# Patient Record
Sex: Male | Born: 1995 | Race: Black or African American | Hispanic: No | Marital: Single | Smoking: Light tobacco smoker
Health system: Southern US, Community
[De-identification: ages and names within clinical notes are randomized; demographics above are authoritative.]

## PROBLEM LIST (undated history)

## (undated) DIAGNOSIS — J45909 Unspecified asthma, uncomplicated: Secondary | ICD-10-CM

---
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
## 2015-10-30 ENCOUNTER — Emergency Department (HOSPITAL_COMMUNITY)
Admission: EM | Admit: 2015-10-30 | Discharge: 2015-10-30 | Disposition: A | Discharge status: Home / Self Care | Payer: Medicaid Other | Attending: Emergency Medicine | Admitting: Emergency Medicine

## 2015-10-30 ENCOUNTER — Encounter (HOSPITAL_COMMUNITY): Payer: Self-pay | Admitting: *Deleted

## 2015-10-30 DIAGNOSIS — J45909 Unspecified asthma, uncomplicated: Secondary | ICD-10-CM | POA: Insufficient documentation

## 2015-10-30 DIAGNOSIS — Z91010 Allergy to peanuts: Secondary | ICD-10-CM | POA: Insufficient documentation

## 2015-10-30 DIAGNOSIS — Z91013 Allergy to seafood: Secondary | ICD-10-CM | POA: Insufficient documentation

## 2015-10-30 DIAGNOSIS — R0600 Dyspnea, unspecified: Secondary | ICD-10-CM | POA: Insufficient documentation

## 2015-10-30 DIAGNOSIS — F17210 Nicotine dependence, cigarettes, uncomplicated: Secondary | ICD-10-CM | POA: Insufficient documentation

## 2015-10-30 DIAGNOSIS — Z91018 Allergy to other foods: Secondary | ICD-10-CM | POA: Insufficient documentation

## 2015-10-30 DIAGNOSIS — R21 Rash and other nonspecific skin eruption: Secondary | ICD-10-CM | POA: Insufficient documentation

## 2015-10-30 DIAGNOSIS — T7840XA Allergy, unspecified, initial encounter: Secondary | ICD-10-CM

## 2015-10-30 MED ORDER — FAMOTIDINE IN NACL 20-0.9 MG/50ML-% IV SOLN
20.0000 mg | Freq: Once | INTRAVENOUS | Status: AC
  Administered 2015-10-30: 20 mg via INTRAVENOUS
  Filled 2015-10-30: qty 50

## 2015-10-30 MED ORDER — PREDNISONE 20 MG PO TABS: 60.0000 mg | ORAL_TABLET | Freq: Every day | ORAL | Status: AC

## 2015-10-30 MED ORDER — FAMOTIDINE 20 MG PO TABS: 20.0000 mg | ORAL_TABLET | Freq: Two times a day (BID) | ORAL | Status: AC

## 2015-10-30 MED ORDER — DIPHENHYDRAMINE HCL 25 MG PO TABS: 25.0000 mg | ORAL_TABLET | Freq: Three times a day (TID) | ORAL | Status: AC | PRN

## 2015-10-30 MED ORDER — ALBUTEROL SULFATE HFA 108 (90 BASE) MCG/ACT IN AERS: 2.0000 | INHALATION_SPRAY | RESPIRATORY_TRACT | Status: AC | PRN

## 2015-10-30 MED ORDER — EPINEPHRINE HCL 0.1 MG/ML IJ SOSY
0.3000 mg | PREFILLED_SYRINGE | Freq: Once | INTRAMUSCULAR | Status: AC
  Administered 2015-10-30: 0.3 mg via INTRAMUSCULAR
  Filled 2015-10-30: qty 10

## 2015-10-30 MED ORDER — DIPHENHYDRAMINE HCL 50 MG/ML IJ SOLN
50.0000 mg | Freq: Once | INTRAMUSCULAR | Status: AC
  Administered 2015-10-30: 50 mg via INTRAVENOUS

## 2015-10-30 MED ORDER — EPINEPHRINE 0.3 MG/0.3ML IJ SOAJ: 0.3000 mg | Freq: Once | INTRAMUSCULAR | Status: AC

## 2015-10-30 MED ORDER — METHYLPREDNISOLONE SODIUM SUCC 125 MG IJ SOLR
125.0000 mg | Freq: Once | INTRAMUSCULAR | Status: AC
  Administered 2015-10-30: 125 mg via INTRAVENOUS
  Filled 2015-10-30: qty 2

## 2015-10-30 MED ORDER — EPINEPHRINE HCL 1 MG/ML IJ SOLN
INTRAMUSCULAR | Status: AC
  Filled 2015-10-30: qty 1

## 2015-10-30 MED ORDER — SODIUM CHLORIDE 0.9 % IV BOLUS (SEPSIS)
1000.0000 mL | Freq: Once | INTRAVENOUS | Status: AC
  Administered 2015-10-30: 1000 mL via INTRAVENOUS

## 2015-10-30 NOTE — ED Notes (Signed)
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  He had eaten a forth of the meal when nhe started getting a rash with itching and his tongue started swelling.  No swelling in the back of his throat.  He hada epi pen but it had expired

## 2015-10-30 NOTE — Discharge Instructions (Signed)
Hives Hives are itchy, red, swollen areas of the skin. They can vary in size and location on your body. Hives can come and go for hours or several days (acute hives) or for several weeks (chronic hives). Hives do not spread from person to person (noncontagious). They may get worse with scratching, exercise, and emotional stress. CAUSES   Allergic reaction to food, additives, or drugs.  Infections, including the common cold.  Illness, such as vasculitis, lupus, or thyroid disease.  Exposure to sunlight, heat, or cold.  Exercise.  Stress.  Contact with chemicals. SYMPTOMS   Red or white swollen patches on the skin. The patches may change size, shape, and location quickly and repeatedly.  Itching.  Swelling of the hands, feet, and face. This may occur if hives develop deeper in the skin. DIAGNOSIS  Your caregiver can usually tell what is wrong by performing a physical exam. Skin or blood tests may also be done to determine the cause of your hives. In some cases, the cause cannot be determined. TREATMENT  Mild cases usually get better with medicines such as antihistamines. Severe cases may require an emergency epinephrine injection. If the cause of your hives is known, treatment includes avoiding that trigger.  HOME CARE INSTRUCTIONS   Avoid causes that trigger your hives.  Take antihistamines as directed by your caregiver to reduce the severity of your hives. Non-sedating or low-sedating antihistamines are usually recommended. Do not drive while taking an antihistamine.  Take any other medicines prescribed for itching as directed by your caregiver.  Wear loose-fitting clothing.  Keep all follow-up appointments as directed by your caregiver. SEEK MEDICAL CARE IF:   You have persistent or severe itching that is not relieved with medicine.  You have painful or swollen joints. SEEK IMMEDIATE MEDICAL CARE IF:   You have a fever.  Your tongue or lips are swollen.  You have  trouble breathing or swallowing.  You feel tightness in the throat or chest.  You have abdominal pain. These problems may be the first sign of a life-threatening allergic reaction. Call your local emergency services (911 in [place] MAKE SURE YOU:   Understand these instructions.  Will watch your condition.  Will get help right away if you are not doing well or get worse.   This information is not intended to replace advice given to you by your health care provider. Make sure you discuss any questions you have with your health care provider.   Document Released: 04/22/2005 Document Revised: 04/27/2013 Document Reviewed: 07/16/2011 Elsevier Interactive Patient Education 2016 Elsevier Inc.   Epinephrine Injection Epinephrine is a medicine given by injection to temporarily treat an emergency allergic reaction. It is also used to treat severe asthmatic attacks and other lung problems. The medicine helps to enlarge (dilate) the small breathing tubes of the lungs. A life-threatening, sudden allergic reaction that involves the whole body is called anaphylaxis. Because of potential side effects, epinephrine should only be used as directed by your caregiver. RISKS AND COMPLICATIONS Possible side effects of epinephrine injections include:  Chest pain.  Irregular or rapid heartbeat.  Shortness of breath.  Nausea.  Vomiting.  Abdominal pain or cramping.  Sweating.  Dizziness.  Weakness.  Headache.  Nervousness. Report all side effects to your caregiver. HOW TO GIVE AN EPINEPHRINE INJECTION Give the epinephrine injection immediately when symptoms of a severe reaction begin. Inject the medicine into the outer thigh or any available, large muscle. Your caregiver can teach you how to do this. You  do not need to remove any clothing. After the injection, call your local emergency services (911 in [place] Even if you improve after the injection, you need to be examined at a hospital  emergency department. Epinephrine works quickly, but it also wears off quickly. Delayed reactions can occur. A delayed reaction may be as serious and dangerous as the initial reaction. HOME CARE INSTRUCTIONS  Make sure you and your family know how to give an epinephrine injection.  Use epinephrine injections as directed by your caregiver. Do not use this medicine more often or in larger doses than prescribed.  Always carry your epinephrine injection or anaphylaxis kit with you. This can be lifesaving if you have a severe reaction.  Store the medicine in a cool, dry place. If the medicine becomes discolored or cloudy, dispose of it properly and replace it with new medicine.  Check the expiration date on your medicine. It may be unsafe to use medicines past their expiration date.  Tell your caregiver about any other medicines you are taking. Some medicines can react badly with epinephrine.  Tell your caregiver about any medical conditions you have, such as diabetes, high blood pressure (hypertension), heart disease, irregular heartbeats, or if you are pregnant. SEEK IMMEDIATE MEDICAL CARE IF:  You have used an epinephrine injection. Call your local emergency services (911 in [place] Even if you improve after the injection, you need to be examined at a hospital emergency department to make sure your allergic reaction is under control. You will also be monitored for adverse effects from the medicine.  You have chest pain.  You have irregular or fast heartbeats.  You have shortness of breath.  You have severe headaches.  You have severe nausea, vomiting, or abdominal cramps.  You have severe pain, swelling, or redness in the area where you gave the injection.   This information is not intended to replace advice given to you by your health care provider. Make sure you discuss any questions you have with your health care provider.   Document Released: 04/19/2000 Document Revised: 07/15/2011  Document Reviewed: 11/09/2014 Elsevier Interactive Patient Education 2016 Elsevier Inc. Bronchospasm, Adult A bronchospasm is when the tubes that carry air in and out of your lungs (airways) spasm or tighten. During a bronchospasm it is hard to breathe. This is because the airways get smaller. A bronchospasm can be triggered by:  Allergies. These may be to animals, pollen, food, or mold.  Infection. This is a common cause of bronchospasm.  Exercise.  Irritants. These include pollution, cigarette smoke, strong odors, aerosol sprays, and paint fumes.  Weather changes.  Stress.  Being emotional. HOME CARE   Always have a plan for getting help. Know when to call your doctor and local emergency services (911 in the [place] Know where you can get emergency care.  Only take medicines as told by your doctor.  If you were prescribed an inhaler or nebulizer machine, ask your doctor how to use it correctly. Always use a spacer with your inhaler if you were given one.  Stay calm during an attack. Try to relax and breathe more slowly.  Control your home environment:  Change your heating and air conditioning filter at least once a month.  Limit your use of fireplaces and wood stoves.  Do not  smoke. Do not  allow smoking in your home.  Avoid perfumes and fragrances.  Get rid of pests (such as roaches and mice) and their droppings.  Throw away plants if you  see mold on them.  Keep your house clean and dust free.  Replace carpet with wood, tile, or vinyl flooring. Carpet can trap dander and dust.  Use allergy-proof pillows, mattress covers, and box spring covers.  Wash bed sheets and blankets every week in hot water. Dry them in a dryer.  Use blankets that are made of polyester or cotton.  Wash hands frequently. GET HELP IF:  You have muscle aches.  You have chest pain.  The thick spit you spit or cough up (sputum) changes from clear or white to yellow, green, gray, or  bloody.  The thick spit you spit or cough up gets thicker.  There are problems that may be related to the medicine you are given such as:  A rash.  Itching.  Swelling.  Trouble breathing. GET HELP RIGHT AWAY IF:  You feel you cannot breathe or catch your breath.  You cannot stop coughing.  Your treatment is not helping you breathe better.  You have very bad chest pain. MAKE SURE YOU:   Understand these instructions.  Will watch your condition.  Will get help right away if you are not doing well or get worse.   This information is not intended to replace advice given to you by your health care provider. Make sure you discuss any questions you have with your health care provider.   Document Released: 02/17/2009 Document Revised: 05/13/2014 Document Reviewed: 10/13/2012 Elsevier Interactive Patient Education [employer].  To find a primary care or specialty doctor please call [phone] or [phone] to access "[place] a Doctor Service."  You may also go on the [facility] website at [email or web address]  There are also multiple [facility] and [facility] practices throughout the [place] that are frequently accepting new patients. You may find a clinic that is close to your home and contact them.  [facility] -  [address] [place] [address] [phone]  [facility] in [place] (also locations in [place] and [place]) -  [place] [address] [address]  [place] [address] [phone]

## 2015-10-30 NOTE — ED Provider Notes (Signed)
TIME SEEN: By signing my name below, I, Samuel Hudson, attest that this documentation has been prepared under the direction and in the presence of Samuel N Ward, DO . Electronically Signed: Levon HedgerElizabeth Hudson, Scribe. 10/30/2015. 2:49 AM.  CHIEF COMPLAINT:  Chief Complaint  Patient presents with  . Allergic Reaction   HPI: HPI Comments:  Samuel Hudson is a 20 y.o. male with PMHx of soy, Shellfish and peanut allergy and asthma who presents to the Emergency Department complaining of hives, feeling like his throat swelling. Patient ate chinese food ~2 hours ago which he states may have contained nuts and experienced airway tightness. Pt complains of associated wheezing, difficulty breathing, and itching rash on neck and back. Pt denies any new soaps, lotions, or detergents. No medicine taken PTA. No other complaints or alleviating factors noted.   ROS: See HPI Constitutional: no fever  Eyes: no drainage  ENT: no runny nose   Cardiovascular:  no chest pain  Resp: no SOB  GI: no vomiting GU: no dysuria Integumentary: rash  Allergy: no hives  Musculoskeletal: no leg swelling  Neurological: no slurred speech ROS otherwise negative  PAST MEDICAL HISTORY/PAST SURGICAL HISTORY:  Past Medical History  Diagnosis Date  . Asthma     MEDICATIONS:  Prior to Admission medications   Medication Sig Start Date End Date Taking? Authorizing Provider  albuterol (PROVENTIL HFA;VENTOLIN HFA) 108 (90 BASE) MCG/ACT inhaler Inhale 2 puffs into the lungs every 6 (six) hours as needed for wheezing or shortness of breath. 12/02/13   Samuel BongEvan S Corey, MD  EPINEPHrine (EPIPEN) 0.3 mg/0.3 mL IJ SOAJ injection Inject 0.3 mLs (0.3 mg total) into the muscle as needed. 12/02/13   Samuel BongEvan S Corey, MD    ALLERGIES:  Allergies  Allergen Reactions  . Peanuts [Peanut Oil] Anaphylaxis, Swelling and Rash    SOCIAL HISTORY:  Social History  Substance Use Topics  . Smoking status: Current Every Day Smoker    Types: Cigars  .  Smokeless tobacco: Not on file  . Alcohol Use: No    FAMILY HISTORY: No family history on file.  EXAM: BP 112/76 mmHg  Pulse 76  Temp(Src) 98.2 F (36.8 C) (Oral)  Resp 20  Ht 6' (1.829 m)  Wt 135 lb 5 oz (61.377 kg)  BMI 18.35 kg/m2  SpO2 98% CONSTITUTIONAL: Alert and oriented and responds appropriately to questions. Well-appearing; well-nourished HEAD: Normocephalic EYES: Conjunctivae clear, PERRL ENT: normal nose; no rhinorrhea; moist mucous membranes; No pharyngeal erythema or petechiae, no tonsillar hypertrophy or exudate, no uvular deviation, no trismus or drooling, normal phonation, no stridor, no dental caries or abscess noted, no Ludwig's angina, tongue sits flat in the bottom of the mouth; no angioedema NECK: Supple, no meningismus, no LAD  CARD: RRR; S1 and S2 appreciated; no murmurs, no clicks, no rubs, no gallops RESP: Normal chest excursion without splinting or tachypnea; breath sounds equal bilaterally but mild scattered expert for wheezing, no rhonchi, no rales, no hypoxia or respiratory distress, speaking full sentences ABD/GI: Normal bowel sounds; non-distended; soft, non-tender, no rebound, no guarding, no peritoneal signs BACK:  The back appears normal and is non-tender to palpation, there is no CVA tenderness EXT: Normal ROM in all joints; non-tender to palpation; no edema; normal capillary refill; no cyanosis, no calf tenderness or swelling    SKIN: Normal color for age and race; warm; patient does have urticaria noted to his back and upper chest and neck, no petechia or purpura, no blisters or desquamation, no rash involving  his palms or mucous membranes NEURO: Moves all extremities equally, sensation to light touch intact diffusely, cranial nerves II through XII intact PSYCH: The patient's mood and manner are appropriate. Grooming and personal hygiene are appropriate.  MEDICAL DECISION MAKING: Patient here with allergic reaction. Reports that he Cong[place] food  around midnight and developed symptoms shortly thereafter. Has allergies to soy, shellfish and nuts. No other new soaps, lotions, detergents or other exposures. Given he feels like his throat and tongue or swelling, has mild wheezing and hives, will give epinephrine, Benadryl, Simon draw, Pepcid and monitor him for 4 hours. Patient comfortable with this plan.  ED PROGRESS: 3:30 AM  Pt's hives have completely resolved. No longer wheezing. Feels like his airway is is opened up again. Completely asymptomatic currently. We'll continue to monitor him until 6:45 AM.   6:45 AM  Pt reports he is still feeling well and at his baseline. No angioedema, tongue swelling, feeling of tightness in the airway. Lungs are still clear and hives have some resolved. We'll discharge with steroid burst, prescription for Benadryl and Pepcid. We'll also give him another prescription for an EpiPen. Discussed return precautions with patient. He verbalizes understanding is comfortable with this plan.    At this time, I do not feel there is any life-threatening condition present. I have reviewed and discussed all results (EKG, imaging, lab, urine as appropriate), exam findings with patient. I have reviewed nursing notes and appropriate previous records.  I feel the patient is safe to be discharged home without further emergent workup. Discussed usual and customary return precautions. Patient and family (if present) verbalize understanding and are comfortable with this plan.  Patient will follow-up with their primary care provider. If they do not have a primary care provider, information for follow-up has been provided to them. All questions have been answered.   CRITICAL CARE Performed by: Raelyn NumberWARD, Samuel N   Total critical care time: 35 minutes  Critical care time was exclusive of separately billable procedures and treating other patients.  Critical care was necessary to treat or prevent imminent or life-threatening  deterioration.  Critical care was time spent personally by me on the following activities: development of treatment plan with patient and/or surrogate as well as nursing, discussions with consultants, evaluation of patient's response to treatment, examination of patient, obtaining history from patient or surrogate, ordering and performing treatments and interventions, ordering and review of laboratory studies, ordering and review of radiographic studies, pulse oximetry and re-evaluation of patient's condition.    I personally performed the services described in this documentation, which was scribed in my presence. The recorded information has been reviewed and is accurate.    Layla MawKristen N Ward, DO 10/30/15 [phone]

## 2016-02-14 ENCOUNTER — Ambulatory Visit (HOSPITAL_COMMUNITY)
Admission: EM | Admit: 2016-02-14 | Discharge: 2016-02-14 | Discharge status: Absent without leave | Payer: Medicaid Other

## 2016-02-17 ENCOUNTER — Ambulatory Visit (INDEPENDENT_AMBULATORY_CARE_PROVIDER_SITE_OTHER): Payer: Medicaid Other

## 2016-02-17 ENCOUNTER — Encounter (HOSPITAL_COMMUNITY): Payer: Self-pay

## 2016-02-17 ENCOUNTER — Ambulatory Visit (HOSPITAL_COMMUNITY)
Admission: EM | Admit: 2016-02-17 | Discharge: 2016-02-17 | Disposition: A | Discharge status: Home / Self Care | Payer: Medicaid Other | Attending: Internal Medicine | Admitting: Internal Medicine

## 2016-02-17 DIAGNOSIS — S39012A Strain of muscle, fascia and tendon of lower back, initial encounter: Secondary | ICD-10-CM
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
## 2016-02-17 NOTE — Discharge Instructions (Signed)
Your xrays are negative too. Continue with the ibuprofen at home. Get the prescription for the muscle relaxer fill. Follow up with [facility] in 1-2 weeks for re-evaluation.
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
## 2016-09-17 ENCOUNTER — Encounter: Payer: Self-pay | Admitting: Medical

## 2016-10-05 ENCOUNTER — Emergency Department (HOSPITAL_COMMUNITY)
Admission: EM | Admit: 2016-10-05 | Discharge: 2016-10-05 | Disposition: A | Discharge status: Home / Self Care | Payer: Self-pay | Attending: Emergency Medicine | Admitting: Emergency Medicine

## 2016-10-05 ENCOUNTER — Emergency Department (HOSPITAL_COMMUNITY): Payer: Self-pay

## 2016-10-05 ENCOUNTER — Encounter (HOSPITAL_COMMUNITY): Payer: Self-pay

## 2016-10-05 DIAGNOSIS — J45909 Unspecified asthma, uncomplicated: Secondary | ICD-10-CM | POA: Insufficient documentation

## 2016-10-05 DIAGNOSIS — R197 Diarrhea, unspecified: Secondary | ICD-10-CM

## 2016-10-05 DIAGNOSIS — J45901 Unspecified asthma with (acute) exacerbation: Secondary | ICD-10-CM

## 2016-10-05 DIAGNOSIS — J069 Acute upper respiratory infection, unspecified: Secondary | ICD-10-CM

## 2016-10-05 DIAGNOSIS — R112 Nausea with vomiting, unspecified: Secondary | ICD-10-CM

## 2016-10-05 DIAGNOSIS — B9789 Other viral agents as the cause of diseases classified elsewhere: Secondary | ICD-10-CM

## 2016-10-05 DIAGNOSIS — J4521 Mild intermittent asthma with (acute) exacerbation: Secondary | ICD-10-CM | POA: Insufficient documentation

## 2016-10-05 DIAGNOSIS — F17290 Nicotine dependence, other tobacco product, uncomplicated: Secondary | ICD-10-CM | POA: Insufficient documentation

## 2016-10-05 DIAGNOSIS — A084 Viral intestinal infection, unspecified: Secondary | ICD-10-CM

## 2016-10-05 MED ORDER — ALBUTEROL SULFATE (2.5 MG/3ML) 0.083% IN NEBU
5.0000 mg | INHALATION_SOLUTION | Freq: Once | RESPIRATORY_TRACT | Status: AC
  Administered 2016-10-05: 5 mg via RESPIRATORY_TRACT
  Filled 2016-10-05: qty 6

## 2016-10-05 MED ORDER — PREDNISONE 20 MG PO TABS
60.0000 mg | ORAL_TABLET | Freq: Once | ORAL | Status: AC
  Administered 2016-10-05: 60 mg via ORAL
  Filled 2016-10-05: qty 3

## 2016-10-05 MED ORDER — PREDNISONE 20 MG PO TABS: ORAL_TABLET | ORAL | 0 refills | Status: AC

## 2016-10-05 MED ORDER — IPRATROPIUM BROMIDE 0.02 % IN SOLN
0.5000 mg | Freq: Once | RESPIRATORY_TRACT | Status: AC
  Administered 2016-10-05: 0.5 mg via RESPIRATORY_TRACT
  Filled 2016-10-05: qty 2.5

## 2016-10-05 MED ORDER — ONDANSETRON 8 MG PO TBDP
8.0000 mg | ORAL_TABLET | Freq: Once | ORAL | Status: AC
  Administered 2016-10-05: 8 mg via ORAL
  Filled 2016-10-05: qty 1

## 2016-10-05 MED ORDER — ALBUTEROL SULFATE HFA 108 (90 BASE) MCG/ACT IN AERS: 1.0000 | INHALATION_SPRAY | Freq: Four times a day (QID) | RESPIRATORY_TRACT | 0 refills | Status: AC | PRN

## 2016-10-05 MED ORDER — ONDANSETRON HCL 8 MG PO TABS
8.0000 mg | ORAL_TABLET | Freq: Three times a day (TID) | ORAL | 0 refills | Status: AC | PRN
Start: 2016-10-05 — End: ?

## 2016-10-05 MED ORDER — ALBUTEROL SULFATE HFA 108 (90 BASE) MCG/ACT IN AERS
2.0000 | INHALATION_SPRAY | Freq: Once | RESPIRATORY_TRACT | Status: AC
  Administered 2016-10-05: 2 via RESPIRATORY_TRACT
  Filled 2016-10-05: qty 6.7

## 2016-10-05 NOTE — ED Triage Notes (Signed)
Pt with asthma. Shortness of breath x 2 days. No inhaler.

## 2016-10-05 NOTE — ED Notes (Signed)
Bed: [facility]PT2 Expected date:  Expected time:  Means of arrival:  Comments: 

## 2016-10-05 NOTE — ED Provider Notes (Signed)
[facility]-EMERGENCY DEPT Provider Note   CSN: [id number] Arrival date & time: 10/05/16  1249     History   Chief Complaint Chief Complaint  Patient presents with  . Asthma  . Shortness of Breath    HPI Samuel Hudson is a 21 y.o. male with a PMHx of asthma, who presents to the ED with complaints of URI symptoms 2 days that made his asthma flareup. Patient reports cough with yellowish clear sputum production, nasal congestion, chest tightness, wheezing, shortness of breath, nausea, one episode of nonbloody nonbilious emesis, and 4 episodes daily of watery nonbloody diarrhea. He states that Imodium is helping with his diarrhea, and he has tried Robitussin and over-the-counter cold and flu medicines with minimal relief of his other symptoms. He has no known and factors. He reports that albuterol on arrival helped with his shortness of breath and this is currently completely resolved. He does not currently have a primary care doctor, due to lack of insurance. He is a nonsmoker, no known sick contacts.  He denies rhinorrhea, ear pain/draiange, sore throat, hemoptysis, fevers, chills, CP, LE swelling, recent travel/surgery/immobilization, family/personal hx of DVT/PE, abd pain, constipation, obstipation, melena, hematochezia, hematemesis, hematuria, dysuria, myalgias, arthralgias, numbness, tingling, focal weakness, or any other complaints at this time.    The history is provided by the patient and medical records. No language interpreter was used.  Shortness of Breath  Associated symptoms include cough, wheezing and vomiting. Pertinent negatives include no fever, no rhinorrhea, no sore throat, no ear pain, no chest pain, no abdominal pain and no leg swelling. Associated medical issues include asthma.  URI   This is a new problem. The current episode started 2 days ago. The problem has not changed since onset.There has been no fever. Associated symptoms include diarrhea, nausea, vomiting, congestion,  cough and wheezing. Pertinent negatives include no chest pain, no abdominal pain, no dysuria, no ear pain, no rhinorrhea and no sore throat. He has tried other medications for the symptoms. The treatment provided mild relief.    Past Medical History:  Diagnosis Date  . Asthma     There are no active problems to display for this patient.   History reviewed. No pertinent surgical history.     Home Medications    Prior to Admission medications   Medication Sig Start Date End Date Taking? Authorizing Provider  albuterol (PROVENTIL HFA;VENTOLIN HFA) 108 (90 BASE) MCG/ACT inhaler Inhale 2 puffs into the lungs every 6 (six) hours as needed for wheezing or shortness of breath. 12/02/13   Rodolph Bong, MD  albuterol (PROVENTIL HFA;VENTOLIN HFA) 108 (90 Base) MCG/ACT inhaler Inhale 2 puffs into the lungs every 4 (four) hours as needed for wheezing or shortness of breath. 10/30/15   Ward, Layla Maw, DO  diphenhydrAMINE (BENADRYL) 25 MG tablet Take 1-2 tablets (25-50 mg total) by mouth every 8 (eight) hours as needed for itching (rash). 10/30/15   Ward, Layla Maw, DO  EPINEPHrine (EPIPEN) 0.3 mg/0.3 mL IJ SOAJ injection Inject 0.3 mLs (0.3 mg total) into the muscle as needed. Patient taking differently: Inject 0.3 mg into the muscle daily as needed (allergic reaction).  12/02/13   Rodolph Bong, MD  EPINEPHrine 0.3 mg/0.3 mL IJ SOAJ injection Inject 0.3 mLs (0.3 mg total) into the muscle once. 10/30/15   Ward, Layla Maw, DO  famotidine (PEPCID) 20 MG tablet Take 1 tablet (20 mg total) by mouth 2 (two) times daily. 10/30/15   Ward, Layla Maw, DO  predniSONE (DELTASONE) 20  MG tablet Take 3 tablets (60 mg total) by mouth daily. 10/30/15   Ward, Layla MawKristen N, DO    Family History History reviewed. No pertinent family history.  Social History Social History  Substance Use Topics  . Smoking status: Current Every Day Smoker    Types: Cigars  . Smokeless tobacco: Never Used  . Alcohol use No      Allergies   Peanuts [peanut oil]; Shellfish allergy; and Soy allergy   Review of Systems Review of Systems  Constitutional: Negative for chills and fever.  HENT: Positive for congestion. Negative for ear discharge, ear pain, rhinorrhea and sore throat.   Respiratory: Positive for cough, chest tightness, shortness of breath and wheezing.   Cardiovascular: Negative for chest pain and leg swelling.  Gastrointestinal: Positive for diarrhea, nausea and vomiting. Negative for abdominal pain, blood in stool and constipation.  Genitourinary: Negative for dysuria and hematuria.  Musculoskeletal: Negative for arthralgias and myalgias.  Skin: Negative for color change.  Allergic/Immunologic: Negative for immunocompromised state.  Neurological: Negative for weakness and numbness.  Psychiatric/Behavioral: Negative for confusion.   All other systems reviewed and are negative for acute change except as noted in the HPI.    Physical Exam Updated Vital Signs BP (!) 157/106 (BP Location: Left Arm)   Pulse (!) 101   Temp 98.8 F (37.1 C) (Oral)   Resp (!) 24   Ht 6' (1.829 m)   Wt 58.7 kg (129 lb 5 oz)   SpO2 100%   BMI 17.54 kg/m   Physical Exam  Constitutional: He is oriented to person, place, and time. Vital signs are normal. He appears well-developed and well-nourished.  Non-toxic appearance. No distress.  Afebrile, nontoxic, NAD  HENT:  Head: Normocephalic and atraumatic.  Mouth/Throat: Oropharynx is clear and moist and mucous membranes are normal.  Eyes: Conjunctivae and EOM are normal. Right eye exhibits no discharge. Left eye exhibits no discharge.  Neck: Normal range of motion. Neck supple.  Cardiovascular: Normal rate, regular rhythm, normal heart sounds and intact distal pulses.  Exam reveals no gallop and no friction rub.   No murmur heard. On exam, HR 90s; RRR, nl s1/s2, no m/r/g, distal pulses intact, no pedal edema   Pulmonary/Chest: Effort normal. No respiratory  distress. He has no decreased breath sounds. He has wheezes. He has rhonchi. He has no rales.  Faint expiratory wheezing and scattered rhonchi throughout all lung fields, no rales, no hypoxia or increased WOB, speaking in full sentences, SpO2 100% on RA   Abdominal: Soft. Normal appearance and bowel sounds are normal. He exhibits no distension. There is no tenderness. There is no rigidity, no rebound, no guarding, no CVA tenderness, no tenderness at McBurney's point and negative Murphy's sign.  Soft, NTND, +BS throughout, no r/g/r, neg murphy's, neg mcburney's, no CVA TTP   Musculoskeletal: Normal range of motion.  MAE x4 Strength and sensation grossly intact in all extremities Distal pulses intact Gait steady No pedal edema, neg homan's bilaterally   Neurological: He is alert and oriented to person, place, and time. He has normal strength. No sensory deficit.  Skin: Skin is warm, dry and intact. No rash noted.  Psychiatric: He has a normal mood and affect.  Nursing note and vitals reviewed.    ED Treatments / Results  Labs (all labs ordered are listed, but only abnormal results are displayed) Labs Reviewed - No data to display  EKG  EKG Interpretation None       Radiology Dg Chest  2 View  Result Date: 10/05/2016 CLINICAL DATA:  Shortness of breath for 2 days. EXAM: CHEST  2 VIEW COMPARISON:  Single-view of the chest 11/15/2013. FINDINGS: The lungs are clear. Heart is normal. No pneumothorax or pleural effusion. No bony abnormality. IMPRESSION: Negative chest. Electronically Signed   By: Drusilla Kanner M.D.   On: 10/05/2016 13:38    Procedures Procedures (including critical care time)  Medications Ordered in ED Medications  albuterol (PROVENTIL HFA;VENTOLIN HFA) 108 (90 Base) MCG/ACT inhaler 2 puff (not administered)  albuterol (PROVENTIL) (2.5 MG/3ML) 0.083% nebulizer solution 5 mg (5 mg Nebulization Given 10/05/16 1316)  ondansetron (ZOFRAN-ODT) disintegrating tablet 8 mg  (8 mg Oral Given 10/05/16 1535)  albuterol (PROVENTIL) (2.5 MG/3ML) 0.083% nebulizer solution 5 mg (5 mg Nebulization Given 10/05/16 1514)  ipratropium (ATROVENT) nebulizer solution 0.5 mg (0.5 mg Nebulization Given 10/05/16 1514)  predniSONE (DELTASONE) tablet 60 mg (60 mg Oral Given 10/05/16 1535)     Initial Impression / Assessment and Plan / ED Course  I have reviewed the triage vital signs and the nursing notes.  Pertinent labs & imaging results that were available during my care of the patient were reviewed by me and considered in my medical decision making (see chart for details).     21 y.o. male here with URI symptoms x2 days which has triggered his asthma symptoms; also reports n/v/d. On exam, overall well appearing, faint expiratory wheezing and scattered rhonchi throughout all fields, no abdominal tenderness. CXR neg. Already given albuterol x1, will give duoneb now, and prednisone; will also give zofran and PO challenge. Likely viral illness; Doubt need for other labs/imaging. Will reassess shortly  4:50 PM Lung sounds greatly improved after nebs, and pt feeling better. Tolerating PO well. Will d/c home with prednisone burst, inhaler from here and rx for an extra one, as well as zofran. Advised staying hydrated, OTC meds for symptomatic relief, BRAT diet, and f/up with [facility] in 1wk for recheck and to establish care. I explained the diagnosis and have given explicit precautions to return to the ER including for any other new or worsening symptoms. The patient understands and accepts the medical plan as it's been dictated and I have answered their questions. Discharge instructions concerning home care and prescriptions have been given. The patient is STABLE and is discharged to home in good condition.    Final Clinical Impressions(s) / ED Diagnoses   Final diagnoses:  Mild asthma with exacerbation, unspecified whether persistent  Viral URI with cough  Viral gastroenteritis  Nausea  vomiting and diarrhea    New Prescriptions New Prescriptions   ALBUTEROL (PROVENTIL HFA;VENTOLIN HFA) 108 (90 BASE) MCG/ACT INHALER    Inhale 1-2 puffs into the lungs every 6 (six) hours as needed for wheezing or shortness of breath.   ONDANSETRON (ZOFRAN) 8 MG TABLET    Take 1 tablet (8 mg total) by mouth every 8 (eight) hours as needed for nausea or vomiting.   PREDNISONE (DELTASONE) 20 MG TABLET    3 tabs po daily x 3 days starting [address] [place], [place] 10/05/16 1650    Shaune Pollack, MD 10/06/16 [phone]

## 2016-10-05 NOTE — Discharge Instructions (Signed)
For your nausea/vomiting/diarrhea: Use zofran as prescribed, as needed for nausea. Alternate between tylenol and motrin as needed for pain. Stay well hydrated with small sips of fluids throughout the day. Follow a BRAT (banana-rice-applesauce-toast) diet as described below for the next 24-48 hours. The 'BRAT' diet is suggested, then progress to diet as tolerated as symptoms abate. Call your regular doctor if bloody stools, persistent diarrhea, vomiting, fever or abdominal pain.   For your cough/asthma/upper respiratory symptoms: Use Mucinex for cough suppression/expectoration of mucus. Use over the counter antihistamines such as zyrtec or allegra to decrease symptoms and frequency of asthma attacks. Use inhaler as directed, as needed for cough/chest congestion/wheezing/shortness of breath/etc. Take prednisone as directed for your asthma exacerbation, starting tomorrow since you received today's dose in the ER today. Followup with the [facility] in 5-7 days for recheck of ongoing symptoms and to establish medical care. Return to emergency department for emergent changing or worsening of symptoms.

## 2017-09-07 ENCOUNTER — Emergency Department (HOSPITAL_COMMUNITY): Payer: No Typology Code available for payment source

## 2017-09-07 ENCOUNTER — Emergency Department (HOSPITAL_COMMUNITY)
Admission: EM | Admit: 2017-09-07 | Discharge: 2017-09-07 | Disposition: A | Discharge status: Home / Self Care | Payer: No Typology Code available for payment source | Attending: Emergency Medicine | Admitting: Emergency Medicine

## 2017-09-07 ENCOUNTER — Other Ambulatory Visit: Payer: Self-pay

## 2017-09-07 ENCOUNTER — Encounter (HOSPITAL_COMMUNITY): Payer: Self-pay

## 2017-09-07 DIAGNOSIS — Y999 Unspecified external cause status: Secondary | ICD-10-CM | POA: Diagnosis not present

## 2017-09-07 DIAGNOSIS — Z91010 Allergy to peanuts: Secondary | ICD-10-CM | POA: Insufficient documentation

## 2017-09-07 DIAGNOSIS — Z87891 Personal history of nicotine dependence: Secondary | ICD-10-CM | POA: Insufficient documentation

## 2017-09-07 DIAGNOSIS — S60222A Contusion of left hand, initial encounter: Secondary | ICD-10-CM | POA: Diagnosis not present

## 2017-09-07 DIAGNOSIS — Z79899 Other long term (current) drug therapy: Secondary | ICD-10-CM | POA: Insufficient documentation

## 2017-09-07 DIAGNOSIS — S8001XA Contusion of right knee, initial encounter: Secondary | ICD-10-CM | POA: Insufficient documentation

## 2017-09-07 DIAGNOSIS — S80211A Abrasion, right knee, initial encounter: Secondary | ICD-10-CM | POA: Insufficient documentation

## 2017-09-07 DIAGNOSIS — Y929 Unspecified place or not applicable: Secondary | ICD-10-CM | POA: Insufficient documentation

## 2017-09-07 DIAGNOSIS — J45909 Unspecified asthma, uncomplicated: Secondary | ICD-10-CM | POA: Insufficient documentation

## 2017-09-07 DIAGNOSIS — S0083XA Contusion of other part of head, initial encounter: Secondary | ICD-10-CM | POA: Insufficient documentation

## 2017-09-07 DIAGNOSIS — S0990XA Unspecified injury of head, initial encounter: Secondary | ICD-10-CM | POA: Diagnosis present

## 2017-09-07 DIAGNOSIS — Y939 Activity, unspecified: Secondary | ICD-10-CM | POA: Insufficient documentation

## 2017-09-07 MED ORDER — METHOCARBAMOL 500 MG PO TABS: 500.0000 mg | ORAL_TABLET | Freq: Two times a day (BID) | ORAL | 0 refills | Status: DC

## 2017-09-07 MED ORDER — NAPROXEN 500 MG PO TABS: 500.0000 mg | ORAL_TABLET | Freq: Two times a day (BID) | ORAL | 0 refills | Status: DC

## 2017-09-07 MED ORDER — BACITRACIN ZINC 500 UNIT/GM EX OINT
TOPICAL_OINTMENT | Freq: Two times a day (BID) | CUTANEOUS | Status: DC
  Administered 2017-09-07: 1 via TOPICAL
  Filled 2017-09-07: qty 0.9
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
## 2017-09-07 NOTE — Discharge Instructions (Signed)
Do not take the muscle relaxer if driving as it will make you sleepy. Return for worsening symptoms.  °

## 2017-09-08 ENCOUNTER — Encounter (HOSPITAL_COMMUNITY): Payer: Self-pay | Admitting: Emergency Medicine

## 2017-09-08 ENCOUNTER — Emergency Department (HOSPITAL_COMMUNITY): Payer: No Typology Code available for payment source

## 2017-09-08 ENCOUNTER — Emergency Department (HOSPITAL_COMMUNITY)
Admission: EM | Admit: 2017-09-08 | Discharge: 2017-09-08 | Disposition: A | Discharge status: Home / Self Care | Payer: No Typology Code available for payment source | Attending: Emergency Medicine | Admitting: Emergency Medicine

## 2017-09-08 DIAGNOSIS — R519 Headache, unspecified: Secondary | ICD-10-CM

## 2017-09-08 DIAGNOSIS — R51 Headache: Secondary | ICD-10-CM | POA: Insufficient documentation

## 2017-09-08 DIAGNOSIS — Z87891 Personal history of nicotine dependence: Secondary | ICD-10-CM | POA: Insufficient documentation

## 2017-09-08 DIAGNOSIS — Z79899 Other long term (current) drug therapy: Secondary | ICD-10-CM | POA: Insufficient documentation

## 2017-09-08 DIAGNOSIS — J45909 Unspecified asthma, uncomplicated: Secondary | ICD-10-CM | POA: Insufficient documentation

## 2017-09-08 DIAGNOSIS — Z91010 Allergy to peanuts: Secondary | ICD-10-CM | POA: Insufficient documentation

## 2017-09-08 MED ORDER — ACETAMINOPHEN 325 MG PO TABS
650.0000 mg | ORAL_TABLET | Freq: Once | ORAL | Status: AC
  Administered 2017-09-08: 650 mg via ORAL
  Filled 2017-09-08: qty 2

## 2017-09-08 MED ORDER — DIPHENHYDRAMINE HCL 25 MG PO CAPS
25.0000 mg | ORAL_CAPSULE | Freq: Once | ORAL | Status: AC
  Administered 2017-09-08: 25 mg via ORAL
  Filled 2017-09-08: qty 1

## 2017-09-08 MED ORDER — METOCLOPRAMIDE HCL 10 MG PO TABS
10.0000 mg | ORAL_TABLET | Freq: Once | ORAL | Status: AC
  Administered 2017-09-08: 10 mg via ORAL
  Filled 2017-09-08: qty 1
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
## 2017-09-08 NOTE — Discharge Instructions (Addendum)
Please take tylenol at home for headache.   As we discussed try to stay away from loud noises and avoid screen time. No contact sports until you are cleared by a doctor.   Please call [facility] to establish care and for follow-up as we discussed.  Return to the emergency department if you have any new or concerning symptoms as we discussed.
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
## 2017-09-08 NOTE — ED Notes (Signed)
Visual acuity in right eye 20/70 and in left eye 20/70

## 2017-09-08 NOTE — ED Notes (Signed)
Pt asked if he was allowed to eat. This Clinical research associate stated the EDP would have to be notified to verify whether that was okay at this time. When passing pt room, pt was eating food brought by visitor.

## 2019-01-10 ENCOUNTER — Emergency Department (HOSPITAL_COMMUNITY)
Admission: EM | Admit: 2019-01-10 | Discharge: 2019-01-10 | Disposition: A | Discharge status: Home / Self Care | Payer: Self-pay | Attending: Emergency Medicine | Admitting: Emergency Medicine

## 2019-01-10 ENCOUNTER — Encounter (HOSPITAL_COMMUNITY): Payer: Self-pay | Admitting: Emergency Medicine

## 2019-01-10 ENCOUNTER — Emergency Department (HOSPITAL_COMMUNITY): Payer: Self-pay

## 2019-01-10 ENCOUNTER — Other Ambulatory Visit: Payer: Self-pay

## 2019-01-10 DIAGNOSIS — R0602 Shortness of breath: Secondary | ICD-10-CM | POA: Insufficient documentation

## 2019-01-10 DIAGNOSIS — F17290 Nicotine dependence, other tobacco product, uncomplicated: Secondary | ICD-10-CM | POA: Insufficient documentation

## 2019-01-10 DIAGNOSIS — Z79899 Other long term (current) drug therapy: Secondary | ICD-10-CM | POA: Insufficient documentation

## 2019-01-10 DIAGNOSIS — Y9389 Activity, other specified: Secondary | ICD-10-CM | POA: Insufficient documentation

## 2019-01-10 DIAGNOSIS — Y999 Unspecified external cause status: Secondary | ICD-10-CM | POA: Insufficient documentation

## 2019-01-10 DIAGNOSIS — J45909 Unspecified asthma, uncomplicated: Secondary | ICD-10-CM | POA: Insufficient documentation

## 2019-01-10 DIAGNOSIS — Y9289 Other specified places as the place of occurrence of the external cause: Secondary | ICD-10-CM | POA: Insufficient documentation

## 2019-01-10 DIAGNOSIS — S29012A Strain of muscle and tendon of back wall of thorax, initial encounter: Secondary | ICD-10-CM | POA: Insufficient documentation

## 2019-01-10 DIAGNOSIS — X500XXA Overexertion from strenuous movement or load, initial encounter: Secondary | ICD-10-CM | POA: Insufficient documentation

## 2019-01-10 DIAGNOSIS — T148XXA Other injury of unspecified body region, initial encounter: Secondary | ICD-10-CM

## 2019-01-10 MED ORDER — NAPROXEN 500 MG PO TABS: 500.0000 mg | ORAL_TABLET | Freq: Two times a day (BID) | ORAL | 0 refills | Status: AC | PRN

## 2019-01-10 MED ORDER — LIDOCAINE 5 % EX PTCH: 1.0000 | MEDICATED_PATCH | CUTANEOUS | 0 refills | Status: AC

## 2019-01-10 MED ORDER — ACETAMINOPHEN 500 MG PO TABS
1000.0000 mg | ORAL_TABLET | Freq: Once | ORAL | Status: AC
  Administered 2019-01-10: 18:00:00 1000 mg via ORAL
  Filled 2019-01-10: qty 2

## 2019-01-10 MED ORDER — KETOROLAC TROMETHAMINE 30 MG/ML IJ SOLN
30.0000 mg | Freq: Once | INTRAMUSCULAR | Status: AC
  Administered 2019-01-10: 30 mg via INTRAMUSCULAR
  Filled 2019-01-10: qty 1

## 2019-01-10 MED ORDER — METHOCARBAMOL 500 MG PO TABS: 500.0000 mg | ORAL_TABLET | Freq: Two times a day (BID) | ORAL | 0 refills | Status: AC | PRN

## 2019-01-10 NOTE — ED Provider Notes (Signed)
[facility] EMERGENCY DEPARTMENT Provider Note   CSN: [id number] Arrival date & time: 01/10/19  1431     History   Chief Complaint Chief Complaint  Patient presents with  . Back Pain    HPI Samuel Hudson is a 23 y.o. male.     The history is provided by the patient and medical records. No language interpreter was used.  Back Pain Associated symptoms: no chest pain, no fever, no numbness and no weakness    Samuel Hudson is a 23 y.o. male  with a PMH of asthma who presents to the Emergency Department complaining of left-sided back / shoulder pain which began after lifting boxes at work yesterday.  Patient states that the pain is sharp and worse with certain movements as well as deep breathing.  Denies any actual shortness of breath, just has pain to the left back with big breaths.  Denies any chest pain.  No leg swelling or calf pain.  No numbness or weakness.  Had a little bit of improvement with Flexeril taken at home.   Past Medical History:  Diagnosis Date  . Asthma     There are no active problems to display for this patient.   History reviewed. No pertinent surgical history.      Home Medications    Prior to Admission medications   Medication Sig Start Date End Date Taking? Authorizing Provider  albuterol (PROVENTIL HFA;VENTOLIN HFA) 108 (90 BASE) MCG/ACT inhaler Inhale 2 puffs into the lungs every 6 (six) hours as needed for wheezing or shortness of breath. 12/02/13   Rodolph Bong, MD  albuterol (PROVENTIL HFA;VENTOLIN HFA) 108 (90 Base) MCG/ACT inhaler Inhale 2 puffs into the lungs every 4 (four) hours as needed for wheezing or shortness of breath. Patient not taking: Reported on 09/08/2017 10/30/15   Britnie Colville, Layla Maw, DO  albuterol (PROVENTIL HFA;VENTOLIN HFA) 108 (90 Base) MCG/ACT inhaler Inhale 1-2 puffs into the lungs every 6 (six) hours as needed for wheezing or shortness of breath. Patient not taking: Reported on 09/08/2017 10/05/16   Street,  [place], PA-C  diphenhydrAMINE (BENADRYL) 25 MG tablet Take 1-2 tablets (25-50 mg total) by mouth every 8 (eight) hours as needed for itching (rash). Patient not taking: Reported on 09/08/2017 10/30/15   Yeraldi Fidler, Layla Maw, DO  EPINEPHrine (EPIPEN) 0.3 mg/0.3 mL IJ SOAJ injection Inject 0.3 mLs (0.3 mg total) into the muscle as needed. Patient taking differently: Inject 0.3 mg into the muscle daily as needed (allergic reaction).  12/02/13   Rodolph Bong, MD  EPINEPHrine 0.3 mg/0.3 mL IJ SOAJ injection Inject 0.3 mLs (0.3 mg total) into the muscle once. 10/30/15   Erlene Devita, Layla Maw, DO  famotidine (PEPCID) 20 MG tablet Take 1 tablet (20 mg total) by mouth 2 (two) times daily. Patient not taking: Reported on 09/08/2017 10/30/15   Myking Sar, Layla Maw, DO  lidocaine (LIDODERM) 5 % Place 1 patch onto the skin daily. Remove & Discard patch within 12 hours or as directed by MD 01/10/19   Amina Menchaca, Chase Picket, PA-C  methocarbamol (ROBAXIN) 500 MG tablet Take 1 tablet (500 mg total) by mouth 2 (two) times daily as needed (muscle soreness). 01/10/19   Angelica Wix, Chase Picket, PA-C  naproxen (NAPROSYN) 500 MG tablet Take 1 tablet (500 mg total) by mouth 2 (two) times daily as needed for mild pain or moderate pain. 01/10/19   Sabiha Sura, Chase Picket, PA-C  ondansetron (ZOFRAN) 8 MG tablet Take 1 tablet (8 mg total) by mouth  every 8 (eight) hours as needed for nausea or vomiting. Patient not taking: Reported on 09/08/2017 10/05/16   Street, [place], PA-C  predniSONE (DELTASONE) 20 MG tablet Take 3 tablets (60 mg total) by mouth daily. Patient not taking: Reported on 09/08/2017 10/30/15   Janari Gagner, Delice Bison, DO  predniSONE (DELTASONE) 20 MG tablet 3 tabs po daily x 3 days starting tomorrow Patient not taking: Reported on 09/08/2017 10/05/16   Street, Lewis, PA-C    Family History History reviewed. No pertinent family history.  Social History Social History   Tobacco Use  . Smoking status: Light Tobacco Smoker    Types: Cigars  . Smokeless  tobacco: Never Used  . Tobacco comment: occasionally  Substance Use Topics  . Alcohol use: Yes    Comment: occasionally  . Drug use: Yes    Types: Marijuana    Comment: Occasionally      Allergies   Peanuts [peanut oil], Shellfish allergy, and Soy allergy   Review of Systems Review of Systems  Constitutional: Negative for fever.  Respiratory: Negative for cough and shortness of breath.   Cardiovascular: Negative for chest pain, palpitations and leg swelling.  Musculoskeletal: Positive for arthralgias and back pain. Negative for neck pain.  Neurological: Negative for weakness and numbness.     Physical Exam Updated Vital Signs BP 122/77 (BP Location: Right Arm)   Pulse 67   Temp 97.8 F (36.6 C) (Oral)   Resp 15   SpO2 99%   Physical Exam Vitals signs and nursing note reviewed.  Constitutional:      General: He is not in acute distress.    Appearance: He is well-developed.  HENT:     Head: Normocephalic and atraumatic.  Neck:     Musculoskeletal: Neck supple.  Cardiovascular:     Rate and Rhythm: Normal rate and regular rhythm.     Heart sounds: Normal heart sounds. No murmur.  Pulmonary:     Effort: Pulmonary effort is normal. No respiratory distress.     Breath sounds: Normal breath sounds. No wheezing or rales.  Musculoskeletal: Normal range of motion.     Comments: No midline C/T/L-spine tenderness.  No tenderness to the clavicle or anterior shoulder.  Tenderness is mostly to the left rhomboid region.  He has 5/5 muscle strength and full range of motion to upper extremities.  Skin:    General: Skin is warm and dry.  Neurological:     Mental Status: He is alert.     Comments: All 4 extremities are neurovascularly intact.      ED Treatments / Results  Labs (all labs ordered are listed, but only abnormal results are displayed) Labs Reviewed - No data to display  EKG None  Radiology Dg Chest 2 View  Result Date: 01/10/2019 CLINICAL DATA:   23 year old male with shortness of breath and chest pain. EXAM: CHEST - 2 VIEW COMPARISON:  Chest radiograph dated 10/05/2016 FINDINGS: The heart size and mediastinal contours are within normal limits. Both lungs are clear. The visualized skeletal structures are unremarkable. IMPRESSION: No active cardiopulmonary disease. Electronically Signed   By: Anner Crete M.D.   On: 01/10/2019 17:20    Procedures Procedures (including critical care time)  Medications Ordered in ED Medications  ketorolac (TORADOL) 30 MG/ML injection 30 mg (30 mg Intramuscular Given 01/10/19 1746)  acetaminophen (TYLENOL) tablet 1,000 mg (1,000 mg Oral Given 01/10/19 1746)     Initial Impression / Assessment and Plan / ED Course  I have reviewed the  triage vital signs and the nursing notes.  Pertinent labs & imaging results that were available during my care of the patient were reviewed by me and considered in my medical decision making (see chart for details).       Launa Grillsaiah Florea is a 23 y.o. male who presents to ED for left posterior shoulder pain after lifting heavy boxes most consistent with rhomboid muscle strain.  Patient states that he does have pain with deep breathing just to the back to this region.  It is very reproducible with palpation and worse with movement as well.  Symptoms musculoskeletal in nature.  He seems most concerned about pain with deep breathing.  Given history of injury with reproducible pain, feel MSK is very most likely and doubt cardiopulmonary etiology.  Specifically doubt PE.  He is requesting a chest x-ray which was done with no acute findings.  His lungs are clear to auscultation bilaterally.  Will treat symptomatically and have him follow-up with PCP.  Did discuss reasons to return to the emergency department including chest pain, shortness of breath, new or worsening symptoms or any additional concerns.  All questions.    Final Clinical Impressions(s) / ED Diagnoses   Final  diagnoses:  Shortness of breath  Muscle strain    ED Discharge Orders         Ordered    naproxen (NAPROSYN) 500 MG tablet  2 times daily PRN     01/10/19 1801    lidocaine (LIDODERM) 5 %  Every 24 hours     01/10/19 1801    methocarbamol (ROBAXIN) 500 MG tablet  2 times daily PRN     01/10/19 1801           Kenyata Guess, Chase PicketJaime Pilcher, PA-C 01/10/19 1807    Alvira MondaySchlossman, Erin, MD 01/13/19 [phone]

## 2019-01-10 NOTE — ED Triage Notes (Signed)
Pt reports that he was lifting boxes at work yesterday and now has a sharp pain in L shoulder with movement and deep breaths. Pt thinks he may have a pinched nerve, but "feels like something is hitting his lung when he breaths"

## 2019-01-10 NOTE — ED Notes (Signed)
Patient Alert and oriented to baseline. Stable and ambulatory to baseline. Patient verbalized understanding of the discharge instructions.  Patient belongings were taken by the patient.   

## 2019-01-10 NOTE — Discharge Instructions (Signed)
It was my pleasure taking care of you today!   You have been seen in the Emergency Department today for back pain.   Naproxen and lidoderm patches as needed for pain. Robaxin is your muscle relaxer. You can also take Tylenol if needed with these medications.  In addition to this, use ice and/or heat for additional pain relief.  Your back pain should get better over the next 2 weeks. Please follow up with your doctor this week for a recheck if still having symptoms. Return to ER for new or worsening symptoms, any additional concerns.   COLD THERAPY DIRECTIONS:  Ice or gel packs can be used to reduce both pain and swelling. Ice is the most helpful within the first 24 to 48 hours after an injury or flareup from overusing a muscle or joint.  Ice is effective, has very few side effects, and is safe for most people to use.
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
## 2020-01-14 ENCOUNTER — Ambulatory Visit: Payer: Self-pay

## 2020-06-27 IMAGING — DX DG CHEST 2V
2 series · 2 of 2 positions shown · non-contrast
Comparison: Chest radiograph dated 10/05/2016

CLINICAL DATA: 23-year-old male with shortness of breath and chest
pain.

EXAM:
CHEST - 2 VIEW

[chest pa]
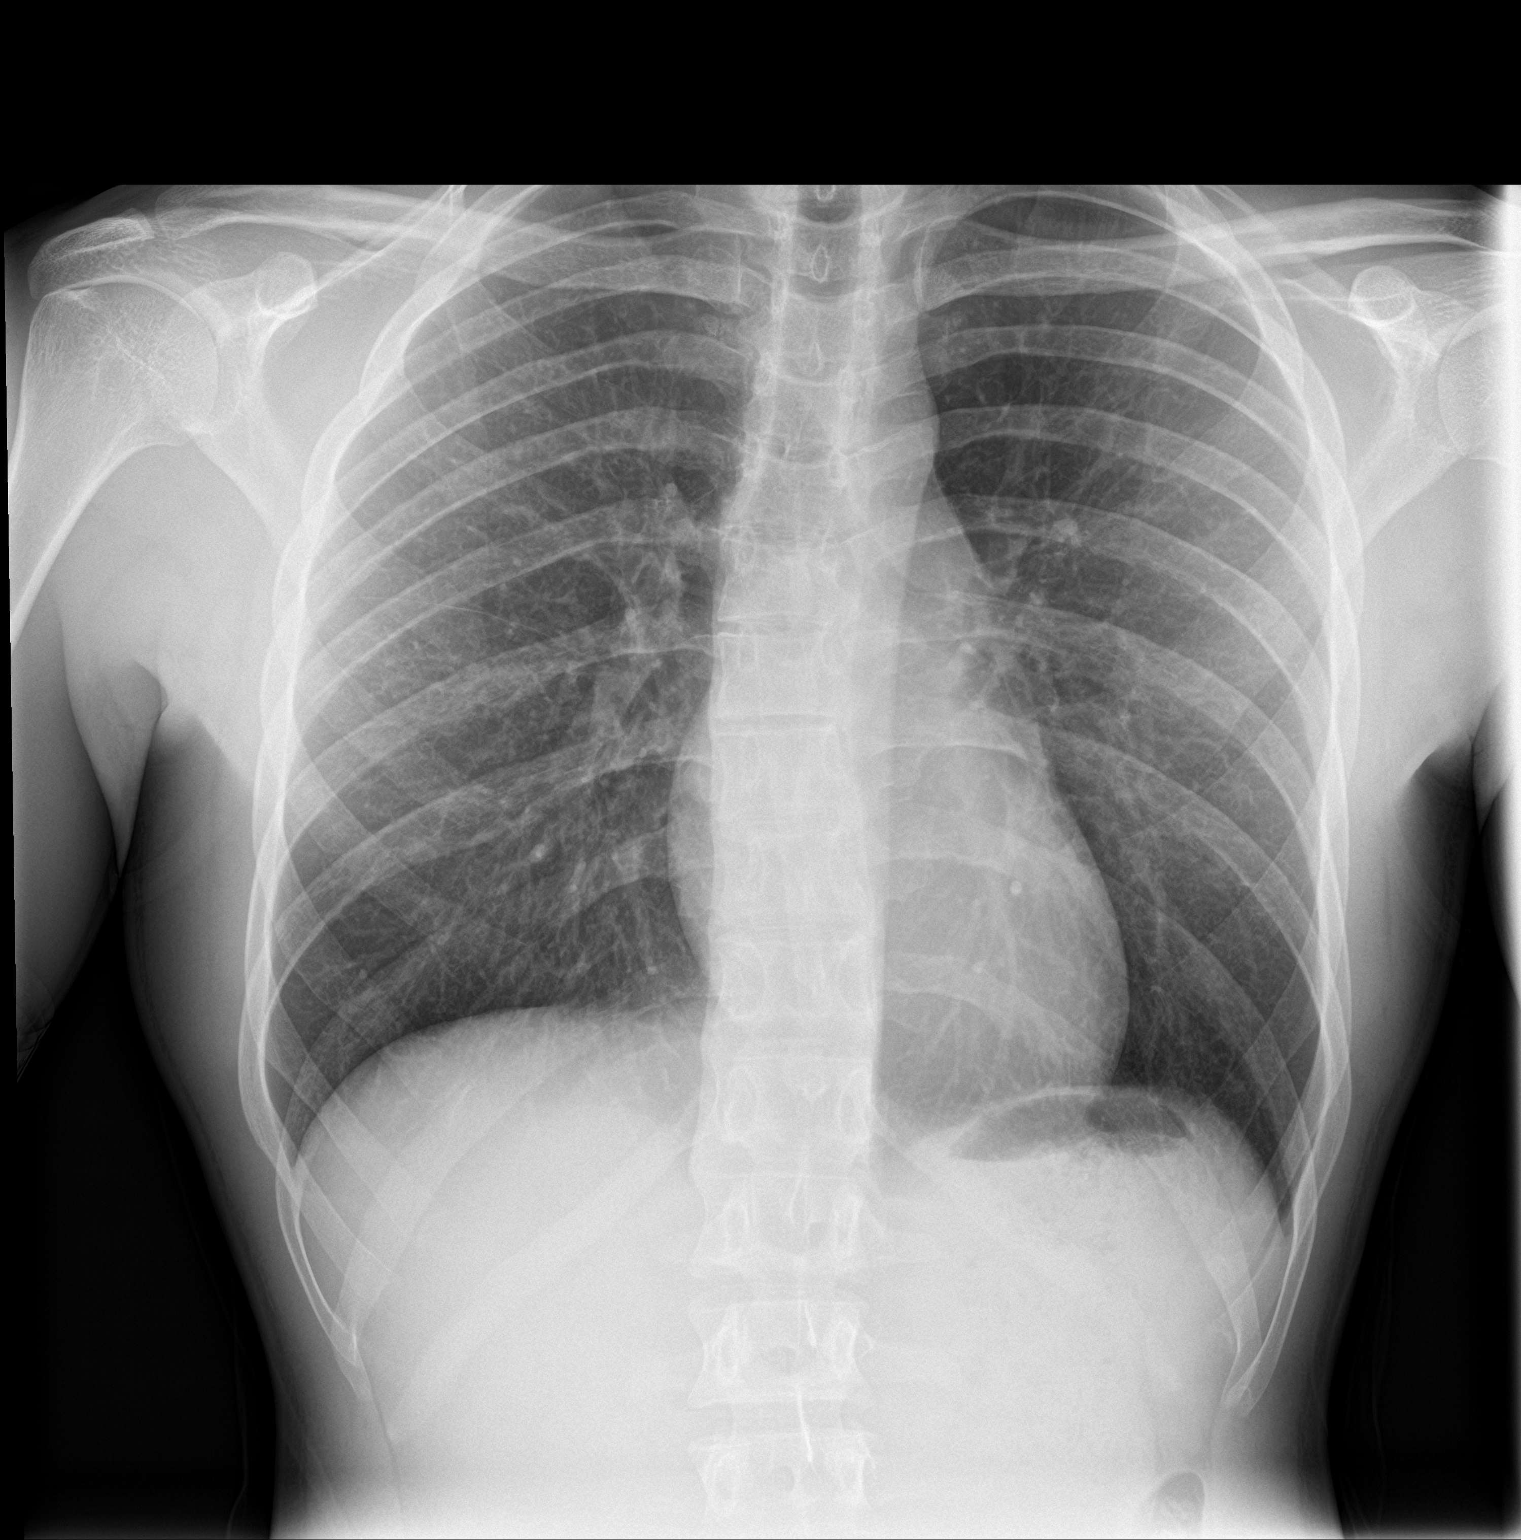

[chest lat]
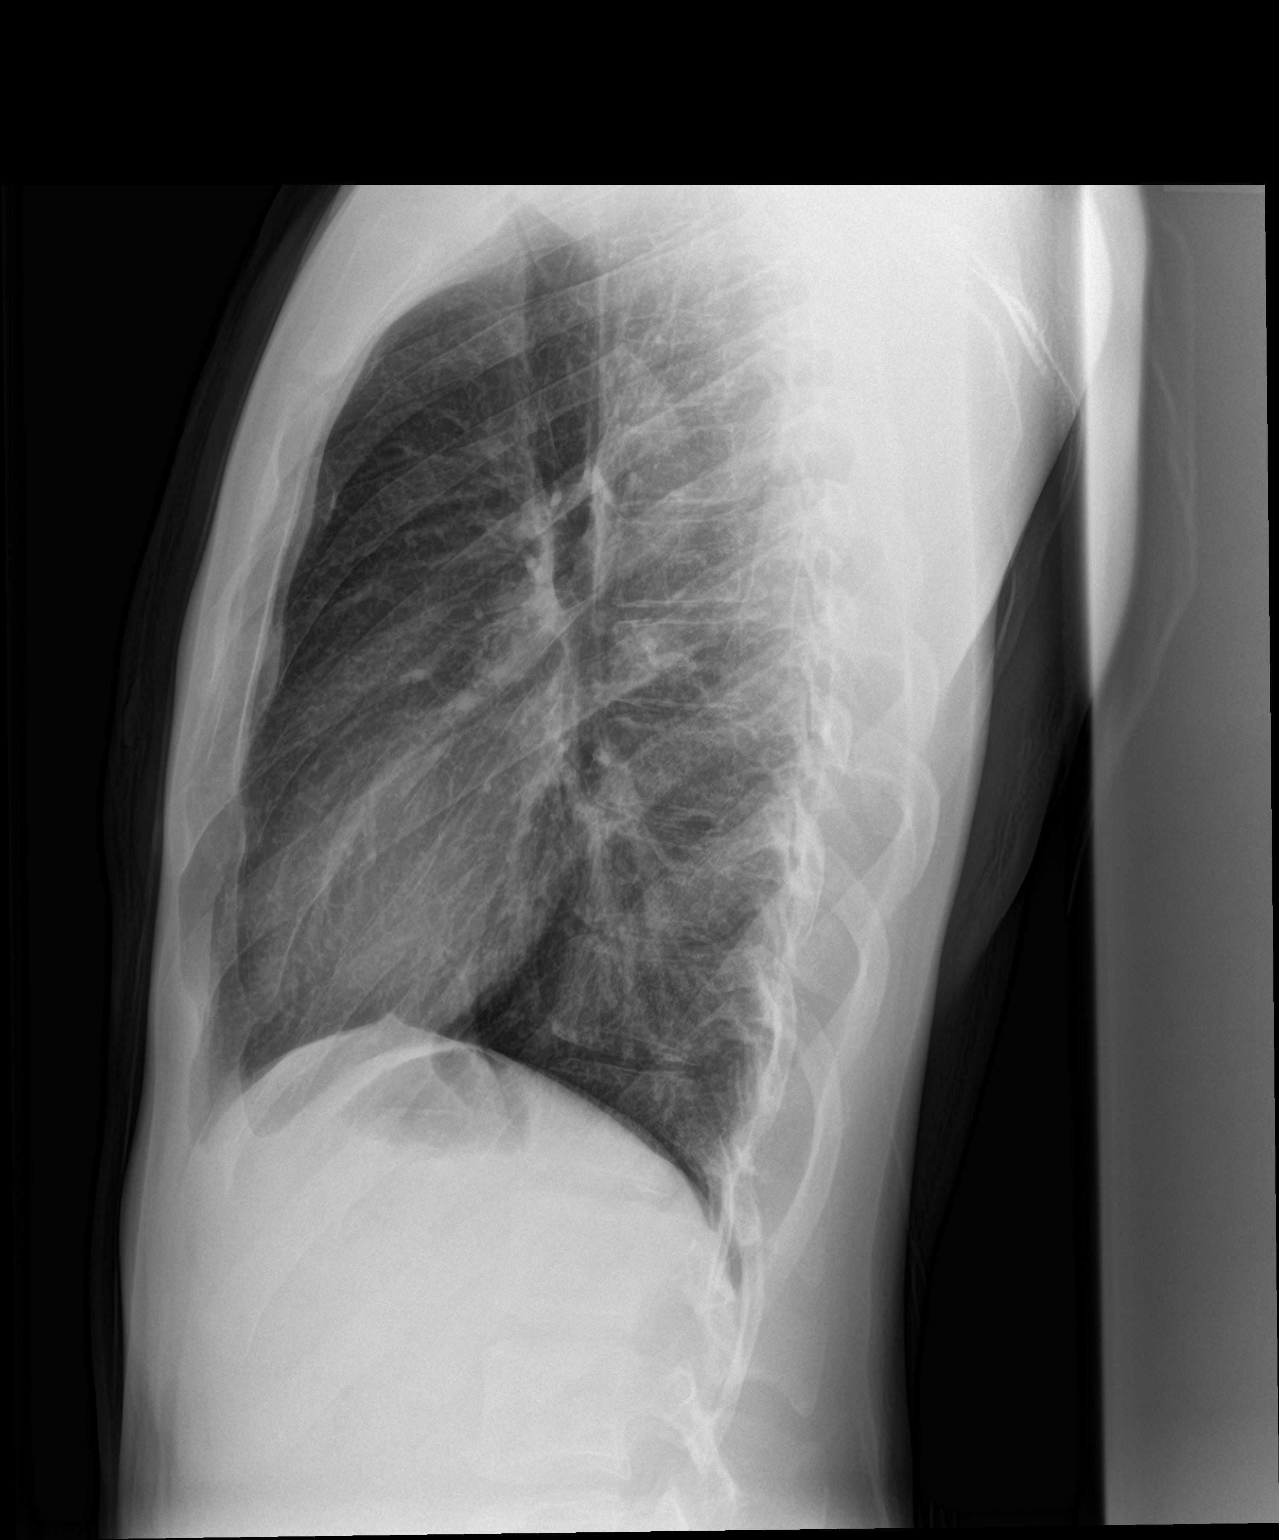

[2 of 2 positions shown; findings below may reference images not displayed]

FINDINGS: The heart size and mediastinal contours are within normal limits.
Both lungs are clear. The visualized skeletal structures are
unremarkable.
IMPRESSION: No active cardiopulmonary disease.

## 2023-01-05 ENCOUNTER — Emergency Department (HOSPITAL_COMMUNITY)
Admission: EM | Admit: 2023-01-05 | Discharge: 2023-01-05 | Disposition: A | Discharge status: Home / Self Care | Payer: 59 | Attending: Emergency Medicine | Admitting: Emergency Medicine

## 2023-01-05 ENCOUNTER — Encounter (HOSPITAL_COMMUNITY): Payer: Self-pay

## 2023-01-05 ENCOUNTER — Other Ambulatory Visit: Payer: Self-pay

## 2023-01-05 DIAGNOSIS — Z7951 Long term (current) use of inhaled steroids: Secondary | ICD-10-CM | POA: Diagnosis not present

## 2023-01-05 DIAGNOSIS — J45909 Unspecified asthma, uncomplicated: Secondary | ICD-10-CM | POA: Diagnosis not present

## 2023-01-05 DIAGNOSIS — T63301A Toxic effect of unspecified spider venom, accidental (unintentional), initial encounter: Secondary | ICD-10-CM

## 2023-01-05 DIAGNOSIS — R600 Localized edema: Secondary | ICD-10-CM | POA: Insufficient documentation

## 2023-01-05 DIAGNOSIS — Z7952 Long term (current) use of systemic steroids: Secondary | ICD-10-CM | POA: Insufficient documentation

## 2023-01-05 DIAGNOSIS — Z91010 Allergy to peanuts: Secondary | ICD-10-CM | POA: Diagnosis not present

## 2023-01-05 MED ORDER — DOXYCYCLINE HYCLATE 100 MG PO TABS
100.0000 mg | ORAL_TABLET | Freq: Once | ORAL | Status: AC
  Administered 2023-01-05: 100 mg via ORAL
  Filled 2023-01-05: qty 1

## 2023-01-05 MED ORDER — DOXYCYCLINE HYCLATE 100 MG PO CAPS: 100.0000 mg | ORAL_CAPSULE | Freq: Two times a day (BID) | ORAL | 0 refills | Status: AC

## 2023-01-05 NOTE — ED Notes (Signed)
..  The patient is A&OX4, ambulatory at d/c with independent steady gait, NAD. Pt verbalized understanding of d/c instructions, prescription and follow up care.  

## 2023-01-05 NOTE — Discharge Instructions (Signed)
You can start the antibiotics in case there is early infection from the bite.  Otherwise it is okay to use the Benadryl as needed for the itching.  Elevating your leg when you are sitting will be helpful for the swelling and when you go back to work at least for a few days wrapping your leg in an Ace wrap or compression sock would also be helpful

## 2023-01-05 NOTE — ED Provider Notes (Signed)
[place] [facility] AT [facility] Provider Note   CSN: [id number] Arrival date & time: 01/05/23  8657     History  Chief Complaint  Patient presents with   Insect Bite    Samuel Hudson is a 27 y.o. male.  Patient is a 27 year old male with a history of asthma presenting today with concern that he was bit by something.  This occurred 2 days ago and reports initially when this happened he was unloading a truck at a warehouse in [place] he went to the emergency room because he developed diffuse hives, itching his throat felt tight.  He reports area on his foot was swollen hot and itchy.  He reports to give him over-the-counter medications which she has been taking and the systemic symptoms have resolved but he continues to have some itching and swelling in his foot.  However he noticed 2 pustules today and was concerned that maybe he had been bitten by a snake.  He did not see anything that bit him.  He denies any systemic symptoms at this time.  The history is provided by the patient.       Home Medications Prior to Admission medications   Medication Sig Start Date End Date Taking? Authorizing Provider  doxycycline (VIBRAMYCIN) 100 MG capsule Take 1 capsule (100 mg total) by mouth 2 (two) times daily. 01/05/23  Yes Daekwon Beswick, Alphonzo Lemmings, MD  albuterol (PROVENTIL HFA;VENTOLIN HFA) 108 (90 BASE) MCG/ACT inhaler Inhale 2 puffs into the lungs every 6 (six) hours as needed for wheezing or shortness of breath. 12/02/13   Rodolph Bong, MD  albuterol (PROVENTIL HFA;VENTOLIN HFA) 108 (90 Base) MCG/ACT inhaler Inhale 2 puffs into the lungs every 4 (four) hours as needed for wheezing or shortness of breath. Patient not taking: Reported on 09/08/2017 10/30/15   Ward, Layla Maw, DO  albuterol (PROVENTIL HFA;VENTOLIN HFA) 108 (90 Base) MCG/ACT inhaler Inhale 1-2 puffs into the lungs every 6 (six) hours as needed for wheezing or shortness of breath. Patient not taking:  Reported on 09/08/2017 10/05/16   Street, [place], PA-C  diphenhydrAMINE (BENADRYL) 25 MG tablet Take 1-2 tablets (25-50 mg total) by mouth every 8 (eight) hours as needed for itching (rash). Patient not taking: Reported on 09/08/2017 10/30/15   Ward, Layla Maw, DO  EPINEPHrine (EPIPEN) 0.3 mg/0.3 mL IJ SOAJ injection Inject 0.3 mLs (0.3 mg total) into the muscle as needed. Patient taking differently: Inject 0.3 mg into the muscle daily as needed (allergic reaction).  12/02/13   Rodolph Bong, MD  EPINEPHrine 0.3 mg/0.3 mL IJ SOAJ injection Inject 0.3 mLs (0.3 mg total) into the muscle once. 10/30/15   Ward, Layla Maw, DO  famotidine (PEPCID) 20 MG tablet Take 1 tablet (20 mg total) by mouth 2 (two) times daily. Patient not taking: Reported on 09/08/2017 10/30/15   Ward, Layla Maw, DO  lidocaine (LIDODERM) 5 % Place 1 patch onto the skin daily. Remove & Discard patch within 12 hours or as directed by MD 01/10/19   Ward, Chase Picket, PA-C  methocarbamol (ROBAXIN) 500 MG tablet Take 1 tablet (500 mg total) by mouth 2 (two) times daily as needed (muscle soreness). 01/10/19   Ward, Chase Picket, PA-C  naproxen (NAPROSYN) 500 MG tablet Take 1 tablet (500 mg total) by mouth 2 (two) times daily as needed for mild pain or moderate pain. 01/10/19   Ward, Chase Picket, PA-C  ondansetron (ZOFRAN) 8 MG tablet Take 1 tablet (8 mg total) by  mouth every 8 (eight) hours as needed for nausea or vomiting. Patient not taking: Reported on 09/08/2017 10/05/16   Street, Keeler, PA-C  predniSONE (DELTASONE) 20 MG tablet Take 3 tablets (60 mg total) by mouth daily. Patient not taking: Reported on 09/08/2017 10/30/15   Ward, Layla Maw, DO  predniSONE (DELTASONE) 20 MG tablet 3 tabs po daily x 3 days starting tomorrow Patient not taking: Reported on 09/08/2017 10/05/16   Street, Bruno, PA-C      Allergies    Peanuts [peanut oil], Shellfish allergy, and Soy allergy    Review of Systems   Review of Systems  Physical Exam Updated Vital  Signs BP 131/77   Pulse 64   Temp 98.5 F (36.9 C)   Resp 16   Ht 6' (1.829 m)   Wt 68 kg   SpO2 100%   BMI 20.34 kg/m  Physical Exam Vitals and nursing note reviewed.  HENT:     Head: Normocephalic.  Cardiovascular:     Rate and Rhythm: Normal rate.     Pulses: Normal pulses.  Pulmonary:     Effort: Pulmonary effort is normal.  Musculoskeletal:        General: No tenderness.     Right lower leg: Edema present.       Legs:  Skin:    General: Skin is warm.  Neurological:     Mental Status: He is alert. Mental status is at baseline.     ED Results / Procedures / Treatments   Labs (all labs ordered are listed, but only abnormal results are displayed) Labs Reviewed - No data to display  EKG None  Radiology No results found.  Procedures Procedures    Medications Ordered in ED Medications  doxycycline (VIBRA-TABS) tablet 100 mg (has no administration in time range)    ED Course/ Medical Decision Making/ A&P                                 Medical Decision Making Risk Prescription drug management.   Patient presenting today with evidence of most likely spider bite with localized reaction that occurred several days ago however cannot exclude early cellulitis.  Patient will continue using the Benadryl as needed recommended elevation, wrapping the foot to help with the swelling.  Will also start doxycycline.  Patient given return precautions.        Final Clinical Impression(s) / ED Diagnoses Final diagnoses:  Spider bite wound, accidental or unintentional, initial encounter    Rx / DC Orders ED Discharge Orders          Ordered    doxycycline (VIBRAMYCIN) 100 MG capsule  2 times daily        01/05/23 2107              Gwyneth Sprout, MD 01/05/23 2107

## 2023-01-05 NOTE — ED Triage Notes (Signed)
Pt arrived POV from home stating he was bit by an insect or maybe a snake at a truck stop last night in [facility] was picked up by EMS and sent to a local ER. They stated he had hives. Pt states he still has pain and swelling in the right ankle.
